# Patient Record
Sex: Male | Born: 1955 | Race: Black or African American | Hispanic: No | Marital: Married | Smoking: Current some day smoker
Health system: Southern US, Community
[De-identification: ages and names within clinical notes are randomized; demographics above are authoritative.]

## PROBLEM LIST (undated history)

## (undated) DIAGNOSIS — E559 Vitamin D deficiency, unspecified: Secondary | ICD-10-CM

## (undated) DIAGNOSIS — I10 Essential (primary) hypertension: Secondary | ICD-10-CM

## (undated) HISTORY — PX: OTHER SURGICAL HISTORY: SHX169

## (undated) HISTORY — DX: Essential (primary) hypertension: I10

## (undated) HISTORY — DX: Vitamin D deficiency, unspecified: E55.9

---

## 2006-04-06 ENCOUNTER — Ambulatory Visit: Payer: Self-pay | Admitting: Family Medicine

## 2006-11-24 ENCOUNTER — Emergency Department (HOSPITAL_COMMUNITY): Admission: EM | Admit: 2006-11-24 | Discharge: 2006-11-24 | Payer: Self-pay | Admitting: Emergency Medicine

## 2006-11-29 ENCOUNTER — Ambulatory Visit: Payer: Self-pay | Admitting: Family Medicine

## 2006-12-06 ENCOUNTER — Ambulatory Visit: Payer: Self-pay | Admitting: Family Medicine

## 2007-12-29 ENCOUNTER — Encounter: Admission: RE | Admit: 2007-12-29 | Discharge: 2007-12-29 | Payer: Self-pay | Admitting: Family Medicine

## 2008-03-05 ENCOUNTER — Ambulatory Visit: Payer: Self-pay | Admitting: Gastroenterology

## 2008-03-19 ENCOUNTER — Ambulatory Visit: Payer: Self-pay | Admitting: Gastroenterology

## 2009-07-26 IMAGING — CT CT ABDOMEN WO/W CM
2 of 5 series · 16 of 46 positions shown, 18 images · IV contrast (READICAT/WATER & [ID] OMNI 300)
Comparison: none

CLINICAL DATA: Left upper quadrant and flank pain. 
 ABDOMEN CT WITHOUT AND WITH CONTRAST:
TECHNIQUE: Multidetector CT imaging of the abdomen was performed both before and during bolus administration of intravenous contrast.
 Contrast:  125 cc Omnipaque 300

[Series 4: routine abdomen · axial · 0.74mm/px · z∈[-276,+14]mm · 13 of 64 slices shown, 15 images]
[im 5/64  soft-tissue]
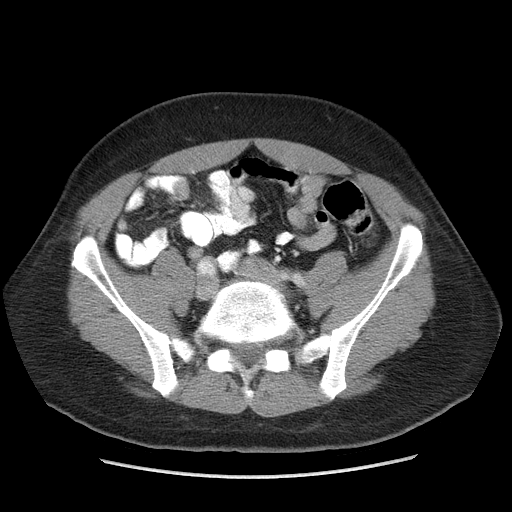
[im 5/64  bone]
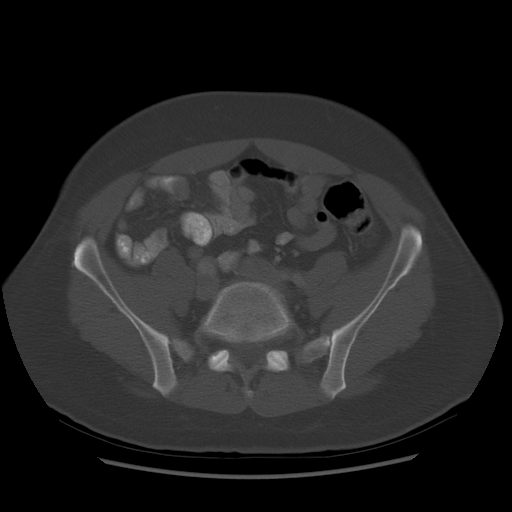
[im 10/64  soft-tissue]
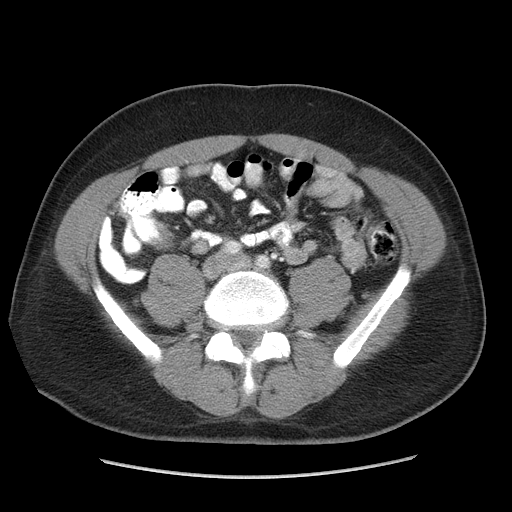
[im 14/64  soft-tissue]
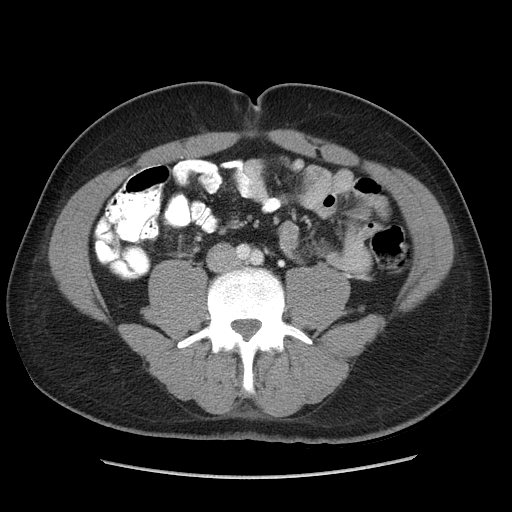
[im 19/64  soft-tissue]
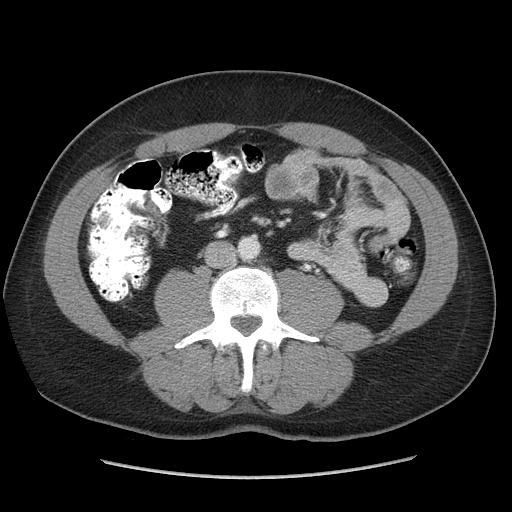
[im 23/64  soft-tissue]
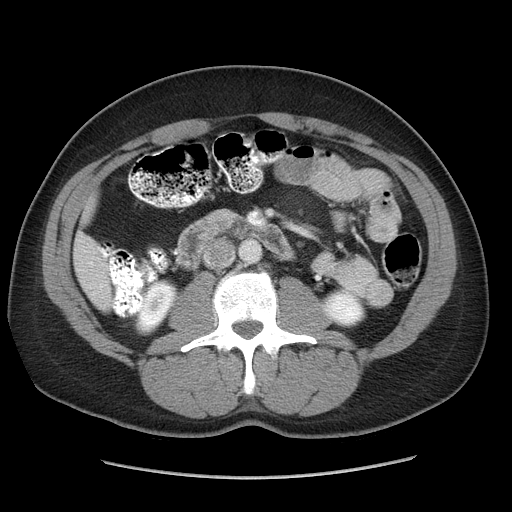
[im 28/64  soft-tissue]
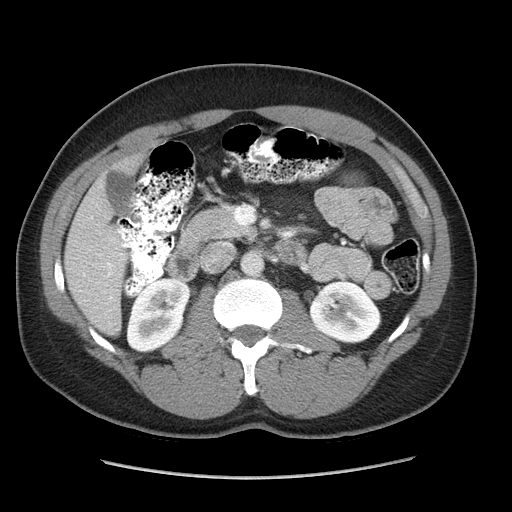
[im 32/64  soft-tissue]
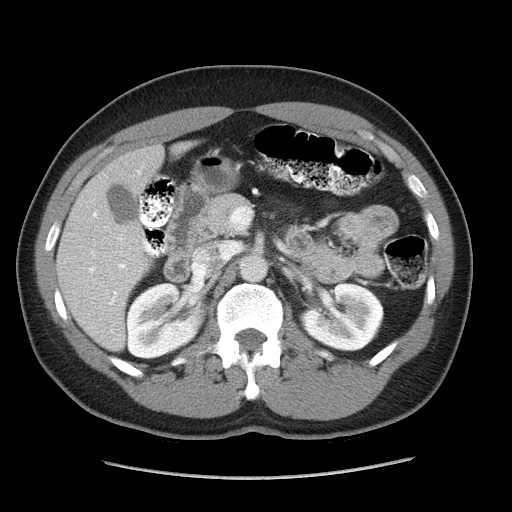
[im 37/64  soft-tissue]
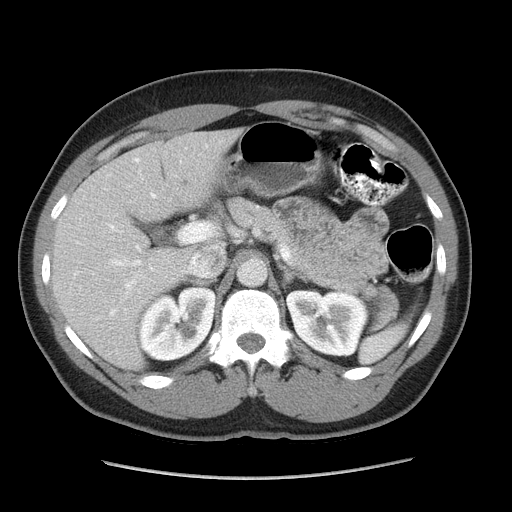
[im 41/64  soft-tissue]
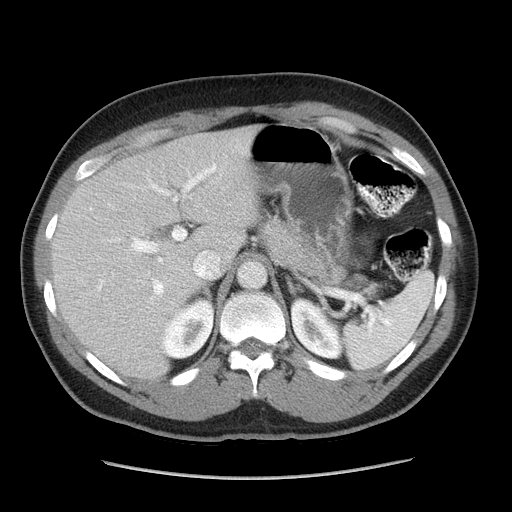
[im 41/64  bone]
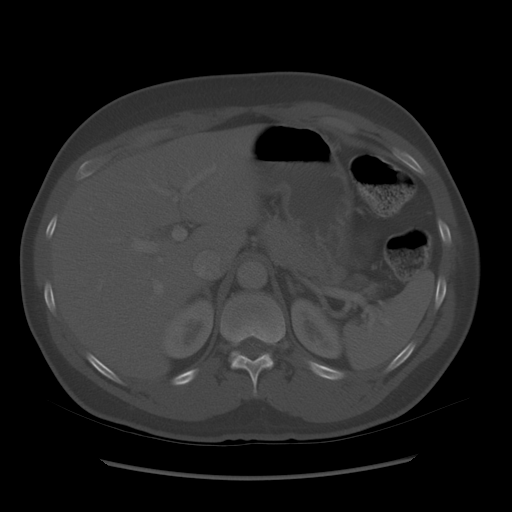
[im 46/64  soft-tissue]
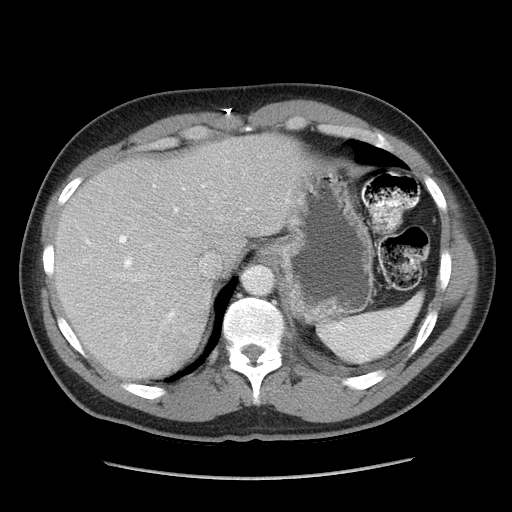
[im 50/64  soft-tissue]
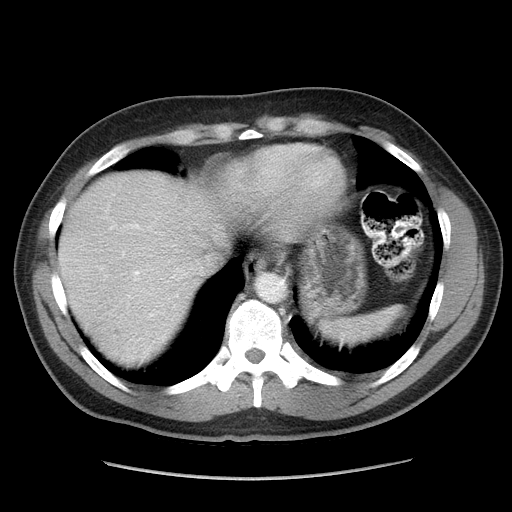
[im 55/64  soft-tissue]
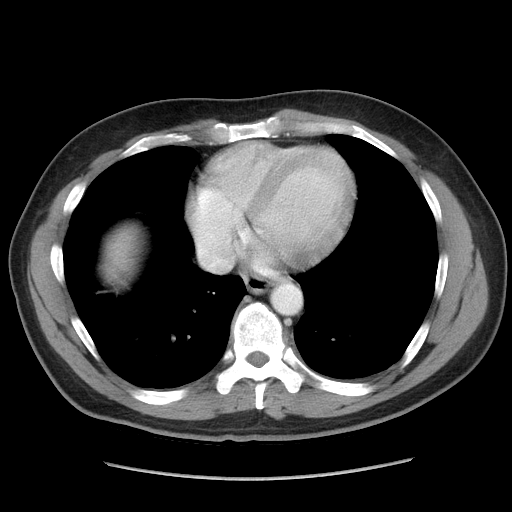
[im 59/64  soft-tissue]
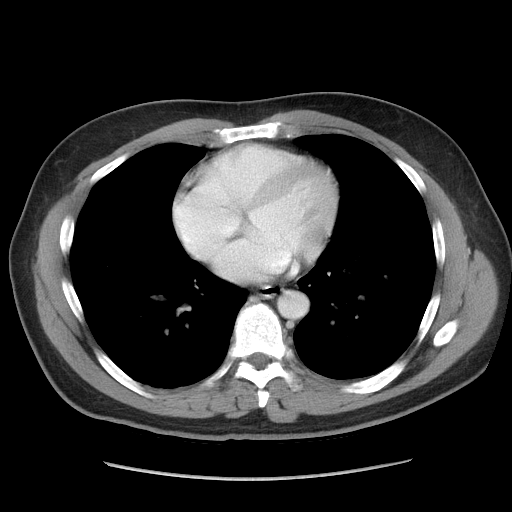

[Series 602: sagittal body · sagittal · 0.74mm/px · 3 of 152 slices shown]
[im 51/152  soft-tissue]
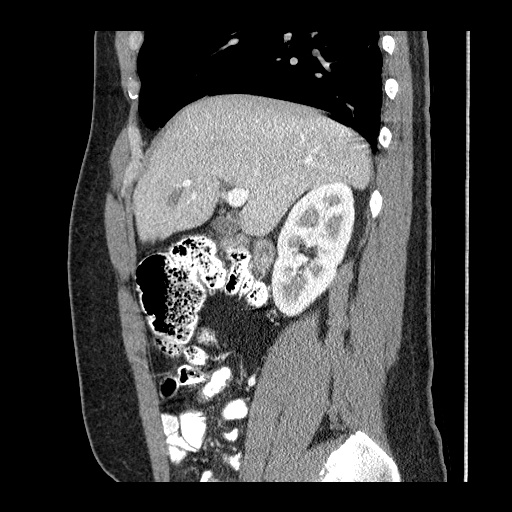
[im 68/152  soft-tissue]
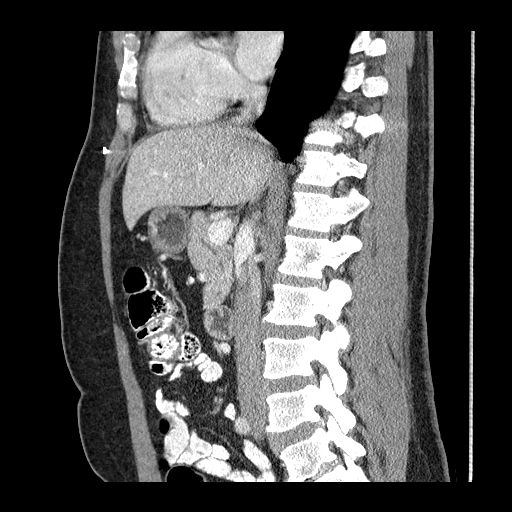
[im 84/152  soft-tissue]
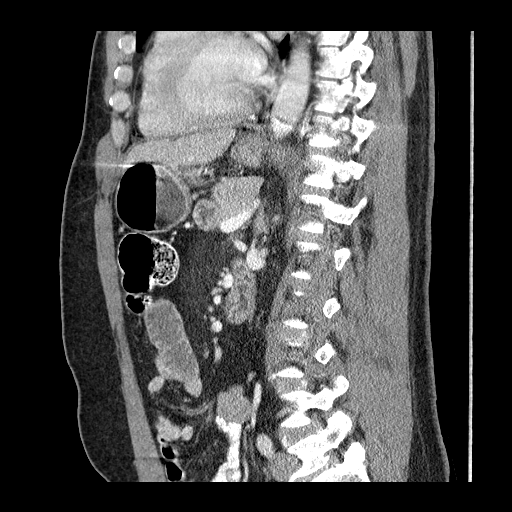

[16 of 46 positions shown; findings below may reference images not displayed]

FINDINGS: The lung bases are clear.  On the unenhanced CT of the abdomen, no renal calculi are seen.  No calcified gallstones are noted.  
 After contrast administration, the liver enhances with only a few small subcentimeter low attenuation structures most consistent with a benign process such as cyst.  Again no gallstones are evident.  The pancreas is normal in size and the pancreatic duct is not dilated.  The adrenal glands and spleen appear normal.  The kidneys enhance and on delayed images, the pelvocaliceal systems appear normal.  The abdominal aorta is normal in caliber.  No adenopathy is seen.  No bony abnormality is noted.
IMPRESSION: Negative CT of the abdomen.

## 2012-11-16 HISTORY — PX: COLONOSCOPY: SHX174

## 2013-03-24 ENCOUNTER — Other Ambulatory Visit: Payer: Self-pay

## 2013-03-28 ENCOUNTER — Encounter: Payer: Self-pay | Admitting: Gastroenterology

## 2013-06-20 ENCOUNTER — Encounter: Payer: Self-pay | Admitting: Gastroenterology

## 2013-08-28 ENCOUNTER — Ambulatory Visit (AMBULATORY_SURGERY_CENTER): Payer: Self-pay

## 2013-08-28 VITALS — Ht 72.0 in | Wt 220.0 lb

## 2013-08-28 DIAGNOSIS — Z800 Family history of malignant neoplasm of digestive organs: Secondary | ICD-10-CM

## 2013-08-29 ENCOUNTER — Encounter: Payer: Self-pay | Admitting: Gastroenterology

## 2013-09-04 ENCOUNTER — Telehealth: Payer: Self-pay | Admitting: Gastroenterology

## 2013-09-12 ENCOUNTER — Encounter: Payer: Self-pay | Admitting: Gastroenterology

## 2013-09-12 ENCOUNTER — Ambulatory Visit (AMBULATORY_SURGERY_CENTER): Payer: 59 | Admitting: Gastroenterology

## 2013-09-12 VITALS — BP 130/81 | HR 67 | Temp 96.3°F | Resp 20 | Ht 72.0 in | Wt 220.0 lb

## 2013-09-12 DIAGNOSIS — Z800 Family history of malignant neoplasm of digestive organs: Secondary | ICD-10-CM

## 2013-09-12 DIAGNOSIS — Z1211 Encounter for screening for malignant neoplasm of colon: Secondary | ICD-10-CM

## 2013-09-12 MED ORDER — SODIUM CHLORIDE 0.9 % IV SOLN: 500.0000 mL | INTRAVENOUS | Status: DC

## 2013-09-12 NOTE — Op Note (Signed)
 [facility] [address] [place], [address]   COLONOSCOPY PROCEDURE REPORT  PATIENT: John Small, John Small  MR#: [id number] BIRTHDATE: 01-06-1956 , 57  yrs. old GENDER: Male ENDOSCOPIST: Meryl Dare, MD, [facility] PROCEDURE DATE:  09/12/2013 PROCEDURE:   Colonoscopy, screening First Screening Colonoscopy - Avg.  risk and is 50 yrs.  old or older - No.  Prior Negative Screening - Now for repeat screening. Above average risk  History of Adenoma - Now for follow-up colonoscopy & has been > or = to 3 yrs.  N/A  Polyps Removed Today? No.  Recommend repeat exam, <10 yrs? Yes.  High risk (family or personal hx). ASA CLASS:   Class I INDICATIONS:Patient's immediate family history of colon cancer and elevated risk screening. MEDICATIONS: MAC sedation, administered by CRNA and propofol (Diprivan) 270mg  IV DESCRIPTION OF PROCEDURE:   After the risks benefits and alternatives of the procedure were thoroughly explained, informed consent was obtained.  A digital rectal exam revealed no abnormalities of the rectum.   The LB [id number] and LB [id number]  endoscope was introduced through the anus and advanced to the cecum, which was identified by both the appendix and ileocecal valve. No adverse events experienced.   The quality of the prep was excellent, using MoviPrep  The instrument was then slowly withdrawn as the colon was fully examined.  COLON FINDINGS: A normal appearing cecum, ileocecal valve, and appendiceal orifice were identified.  The ascending, hepatic flexure, transverse, splenic flexure, descending, sigmoid colon and rectum appeared unremarkable.  No polyps or cancers were seen. Retroflexed views revealed no abnormalities. The time to cecum=2 minutes 03 seconds.  Withdrawal time=8 minutes 37 seconds.  The scope was withdrawn and the procedure completed.  COMPLICATIONS: There were no complications.  ENDOSCOPIC IMPRESSION: 1.  Normal  colon  RECOMMENDATIONS: 1.  Repeat Colonoscopy in 5 years.  eSigned:  Meryl Dare, MD, [facility] 09/12/2013 11:36 AM

## 2013-09-12 NOTE — Progress Notes (Signed)
A/ox3 pleased with MAC, report to Rozell Searing

## 2013-09-12 NOTE — Patient Instructions (Signed)
YOU HAD AN ENDOSCOPIC PROCEDURE TODAY AT THE [facility]: Refer to the procedure report that was given to you for any specific questions about what was found during the examination.  If the procedure report does not answer your questions, please call your gastroenterologist to clarify.  If you requested that your care partner not be given the details of your procedure findings, then the procedure report has been included in a sealed envelope for you to review at your convenience later.  YOU SHOULD EXPECT: Some feelings of bloating in the abdomen. Passage of more gas than usual.  Walking can help get rid of the air that was put into your GI tract during the procedure and reduce the bloating. If you had a lower endoscopy (such as a colonoscopy or flexible sigmoidoscopy) you may notice spotting of blood in your stool or on the toilet paper. If you underwent a bowel prep for your procedure, then you may not have a normal bowel movement for a few days.  DIET: Your first meal following the procedure should be a light meal and then it is ok to progress to your normal diet.  A half-sandwich or bowl of soup is an example of a good first meal.  Heavy or fried foods are harder to digest and may make you feel nauseous or bloated.  Likewise meals heavy in dairy and vegetables can cause extra gas to form and this can also increase the bloating.  Drink plenty of fluids but you should avoid alcoholic beverages for 24 hours.  ACTIVITY: Your care partner should take you home directly after the procedure.  You should plan to take it easy, moving slowly for the rest of the day.  You can resume normal activity the day after the procedure however you should NOT DRIVE or use heavy machinery for 24 hours (because of the sedation medicines used during the test).    SYMPTOMS TO REPORT IMMEDIATELY: A gastroenterologist can be reached at any hour.  During normal business hours, 8:30 AM to 5:00 PM Monday through Friday,  call [phone].  After hours and on weekends, please call the GI answering service at [phone] who will take a message and have the physician on call contact you.   Following lower endoscopy (colonoscopy or flexible sigmoidoscopy):  Excessive amounts of blood in the stool  Significant tenderness or worsening of abdominal pains  Swelling of the abdomen that is new, acute  Fever of 100F or higher  FOLLOW UP: If any biopsies were taken you will be contacted by phone or by letter within the next 1-3 weeks.  Call your gastroenterologist if you have not heard about the biopsies in 3 weeks.  Our staff will call the home number listed on your records the next business day following your procedure to check on you and address any questions or concerns that you may have at that time regarding the information given to you following your procedure. This is a courtesy call and so if there is no answer at the home number and we have not heard from you through the emergency physician on call, we will assume that you have returned to your regular daily activities without incident.  SIGNATURES/CONFIDENTIALITY: You and/or your care partner have signed paperwork which will be entered into your electronic medical record.  These signatures attest to the fact that that the information above on your After Visit Summary has been reviewed and is understood.  Full responsibility of the confidentiality of this   discharge information lies with you and/or your care-partner.  Recommendations See procedure report  

## 2013-09-12 NOTE — Progress Notes (Signed)
Patient did not have preoperative order for IV antibiotic SSI prophylaxis. (G8918)  Patient did not experience any of the following events: a burn prior to discharge; a fall within the facility; wrong site/side/patient/procedure/implant event; or a hospital transfer or hospital admission upon discharge from the facility. (G8907)  

## 2013-09-13 ENCOUNTER — Telehealth: Payer: Self-pay

## 2013-09-13 NOTE — Telephone Encounter (Signed)
  Follow up Call-  Call back number 09/12/2013  Post procedure Call Back phone  # [phone]  Permission to leave phone message Yes     Patient questions:  Do you have a fever, pain , or abdominal swelling? no Pain Score  0 *  Have you tolerated food without any problems? yes  Have you been able to return to your normal activities? yes  Do you have any questions about your discharge instructions: Diet   no Medications  no Follow up visit  no  Do you have questions or concerns about your Care? no  Actions: * If pain score is 4 or above: No action needed, pain <  No problems per the pt. Maw

## 2013-09-21 NOTE — Telephone Encounter (Signed)
See Previous Encounter

## 2018-09-12 ENCOUNTER — Encounter: Payer: Self-pay | Admitting: Gastroenterology

## 2021-02-03 ENCOUNTER — Encounter (INDEPENDENT_AMBULATORY_CARE_PROVIDER_SITE_OTHER): Payer: Self-pay | Admitting: *Deleted

## 2021-02-13 ENCOUNTER — Encounter: Payer: Self-pay | Admitting: Gastroenterology

## 2021-04-17 ENCOUNTER — Ambulatory Visit (AMBULATORY_SURGERY_CENTER): Payer: 59

## 2021-04-17 VITALS — Ht 72.0 in | Wt 230.0 lb

## 2021-04-17 DIAGNOSIS — Z1211 Encounter for screening for malignant neoplasm of colon: Secondary | ICD-10-CM

## 2021-04-17 MED ORDER — NA SULFATE-K SULFATE-MG SULF 17.5-3.13-1.6 GM/177ML PO SOLN: 1.0000 | Freq: Once | ORAL | 0 refills | Status: AC

## 2021-04-17 NOTE — Progress Notes (Signed)
No egg or soy allergy known to patient  No issues with past sedation with any surgeries or procedures Patient denies ever being told they had issues or difficulty with intubation  No FH of Malignant Hyperthermia No diet pills per patient No home 02 use per patient  No blood thinners per patient  Pt denies issues with constipation  No A fib or A flutter  EMMI video to pt or via MyChart  COVID 19 guidelines implemented in [place] today with Pt and RN    VIRTUAL PREVISIT  Due to the COVID-19 pandemic we are asking patients to follow certain guidelines.  Pt aware of COVID protocols and [facility] guidelines   

## 2021-04-30 ENCOUNTER — Encounter: Payer: Self-pay | Admitting: Gastroenterology

## 2021-05-01 ENCOUNTER — Ambulatory Visit (AMBULATORY_SURGERY_CENTER): Payer: 59 | Admitting: Gastroenterology

## 2021-05-01 ENCOUNTER — Other Ambulatory Visit: Payer: Self-pay

## 2021-05-01 ENCOUNTER — Encounter: Payer: Self-pay | Admitting: Gastroenterology

## 2021-05-01 VITALS — BP 110/74 | HR 62 | Temp 97.1°F | Resp 19 | Ht 72.0 in | Wt 230.0 lb

## 2021-05-01 DIAGNOSIS — Z1211 Encounter for screening for malignant neoplasm of colon: Secondary | ICD-10-CM | POA: Diagnosis not present

## 2021-05-01 DIAGNOSIS — Z800 Family history of malignant neoplasm of digestive organs: Secondary | ICD-10-CM

## 2021-05-01 MED ORDER — SODIUM CHLORIDE 0.9 % IV SOLN: 500.0000 mL | Freq: Once | INTRAVENOUS | Status: DC

## 2021-05-01 NOTE — Op Note (Signed)
[facility] Patient Name: John Small Procedure Date: 05/01/2021 9:55 AM MRN: [id number] Endoscopist: Meryl Dare , MD Age: 65 Referring MD:  Date of Birth: 07/13/1956 Gender: Male Account #: [id number] Procedure:                Colonoscopy Indications:              Screening patient at increased risk: Family history                            of colorectal cancer in 1st-degree and 2nd-degree                            relatives Medicines:                Monitored Anesthesia Care Procedure:                Pre-Anesthesia Assessment:                           - Prior to the procedure, a History and Physical                            was performed, and patient medications and                            allergies were reviewed. The patient's tolerance of                            previous anesthesia was also reviewed. The risks                            and benefits of the procedure and the sedation                            options and risks were discussed with the patient.                            All questions were answered, and informed consent                            was obtained. Prior Anticoagulants: The patient has                            taken no previous anticoagulant or antiplatelet                            agents. ASA Grade Assessment: II - A patient with                            mild systemic disease. After reviewing the risks                            and benefits, the patient was deemed in  satisfactory condition to undergo the procedure.                           After obtaining informed consent, the colonoscope                            was passed under direct vision. Throughout the                            procedure, the patient's blood pressure, pulse, and                            oxygen saturations were monitored continuously. The                            Olympus [id number]) Colonoscope was                             introduced through the anus and advanced to the the                            cecum, identified by appendiceal orifice and                            ileocecal valve. The ileocecal valve, appendiceal                            orifice, and rectum were photographed. The quality                            of the bowel preparation was adequate. The                            colonoscopy was performed without difficulty. The                            patient tolerated the procedure well. Scope In: 10:10:17 AM Scope Out: 10:24:18 AM Scope Withdrawal Time: 0 hours 11 minutes 19 seconds  Total Procedure Duration: 0 hours 14 minutes 1 second  Findings:                 The perianal and digital rectal examinations were                            normal.                           Internal hemorrhoids were found during                            retroflexion. The hemorrhoids were small and Grade                            I (internal hemorrhoids that do not prolapse).  The exam was otherwise without abnormality on                            direct and retroflexion views. Complications:            No immediate complications. Estimated blood loss:                            None. Estimated Blood Loss:     Estimated blood loss: none. Impression:               - Internal hemorrhoids.                           - The examination was otherwise normal on direct                            and retroflexion views.                           - No specimens collected. Recommendation:           - Repeat colonoscopy in 5 years for screening                            purposes.                           - Patient has a contact number available for                            emergencies. The signs and symptoms of potential                            delayed complications were discussed with the                            patient. Return to normal activities tomorrow.                             Written discharge instructions were provided to the                            patient.                           - Resume previous diet.                           - Continue present medications. Meryl Dare, MD 05/01/2021 10:33:21 AM This report has been signed electronically.

## 2021-05-01 NOTE — Progress Notes (Signed)
Report to PACU, RN, vss, BBS= Clear.  

## 2021-05-01 NOTE — Progress Notes (Signed)
Medical history reviewed with no changes since P.V. VS assessed by J.K, RN 

## 2021-05-01 NOTE — Patient Instructions (Signed)
Handout provided on hemorrhoids.   Repeat colonoscopy in 5 years for screening purposes.   YOU HAD AN ENDOSCOPIC PROCEDURE TODAY AT THE [facility]:   Refer to the procedure report that was given to you for any specific questions about what was found during the examination.  If the procedure report does not answer your questions, please call your gastroenterologist to clarify.  If you requested that your care partner not be given the details of your procedure findings, then the procedure report has been included in a sealed envelope for you to review at your convenience later.  YOU SHOULD EXPECT: Some feelings of bloating in the abdomen. Passage of more gas than usual.  Walking can help get rid of the air that was put into your GI tract during the procedure and reduce the bloating. If you had a lower endoscopy (such as a colonoscopy or flexible sigmoidoscopy) you may notice spotting of blood in your stool or on the toilet paper. If you underwent a bowel prep for your procedure, you may not have a normal bowel movement for a few days.  Please Note:  You might notice some irritation and congestion in your nose or some drainage.  This is from the oxygen used during your procedure.  There is no need for concern and it should clear up in a day or so.  SYMPTOMS TO REPORT IMMEDIATELY:  Following lower endoscopy (colonoscopy or flexible sigmoidoscopy):  Excessive amounts of blood in the stool  Significant tenderness or worsening of abdominal pains  Swelling of the abdomen that is new, acute  Fever of 100F or higher  For urgent or emergent issues, a gastroenterologist can be reached at any hour by calling [phone]. Do not use MyChart messaging for urgent concerns.    DIET:  We do recommend a small meal at first, but then you may proceed to your regular diet.  Drink plenty of fluids but you should avoid alcoholic beverages for 24 hours.  ACTIVITY:  You should plan to take it easy  for the rest of today and you should NOT DRIVE or use heavy machinery until tomorrow (because of the sedation medicines used during the test).    FOLLOW UP: Our staff will call the number listed on your records 48-72 hours following your procedure to check on you and address any questions or concerns that you may have regarding the information given to you following your procedure. If we do not reach you, we will leave a message.  We will attempt to reach you two times.  During this call, we will ask if you have developed any symptoms of COVID 19. If you develop any symptoms (ie: fever, flu-like symptoms, shortness of breath, cough etc.) before then, please call [phone].  If you test positive for Covid 19 in the 2 weeks post procedure, please call and report this information to [place].    If any biopsies were taken you will be contacted by phone or by letter within the next 1-3 weeks.  Please call us at [phone] if you have not heard about the biopsies in 3 weeks.    SIGNATURES/CONFIDENTIALITY: You and/or your care partner have signed paperwork which will be entered into your electronic medical record.  These signatures attest to the fact that that the information above on your After Visit Summary has been reviewed and is understood.  Full responsibility of the confidentiality of this discharge information lies with you and/or your care-partner.

## 2021-05-05 ENCOUNTER — Telehealth: Payer: Self-pay | Admitting: *Deleted

## 2021-05-05 NOTE — Telephone Encounter (Signed)
Have you developed a fever since your procedure? no  2.   Have you had an respiratory symptoms (SOB or cough) since your procedure? no  3.   Have you tested positive for COVID 19 since your procedure no  4.   Have you had any family members/close contacts diagnosed with the COVID 19 since your procedure?  no   If yes to any of these questions please route to Laverna Peace, RN and Karlton Lemon, RN  Follow up Call-  Call back number 05/01/2021  Post procedure Call Back phone  # [phone]  Permission to leave phone message Yes  Some recent data might be hidden     Patient questions:  Do you have a fever, pain , or abdominal swelling? No. Pain Score  0 *  Have you tolerated food without any problems? Yes.    Have you been able to return to your normal activities? Yes.    Do you have any questions about your discharge instructions: Diet   No. Medications  No. Follow up visit  No.  Do you have questions or concerns about your Care? No.  Actions: * If pain score is 4 or above: No action needed, pain <4.
